# Patient Record
Sex: Male | Born: 2000 | Race: White | Hispanic: No | Marital: Single | Smoking: Never smoker
Health system: Southern US, Community
[De-identification: ages and names within clinical notes are randomized; demographics above are authoritative.]

## PROBLEM LIST (undated history)

## (undated) DIAGNOSIS — F419 Anxiety disorder, unspecified: Secondary | ICD-10-CM

---
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
## 2018-11-06 IMAGING — CR DG HAND COMPLETE 3+V*L*
3 series · 3 of 3 positions shown · non-contrast
Comparison: None.

CLINICAL DATA: Injury

EXAM:
LEFT HAND - COMPLETE 3+ VIEW

[hand ap]
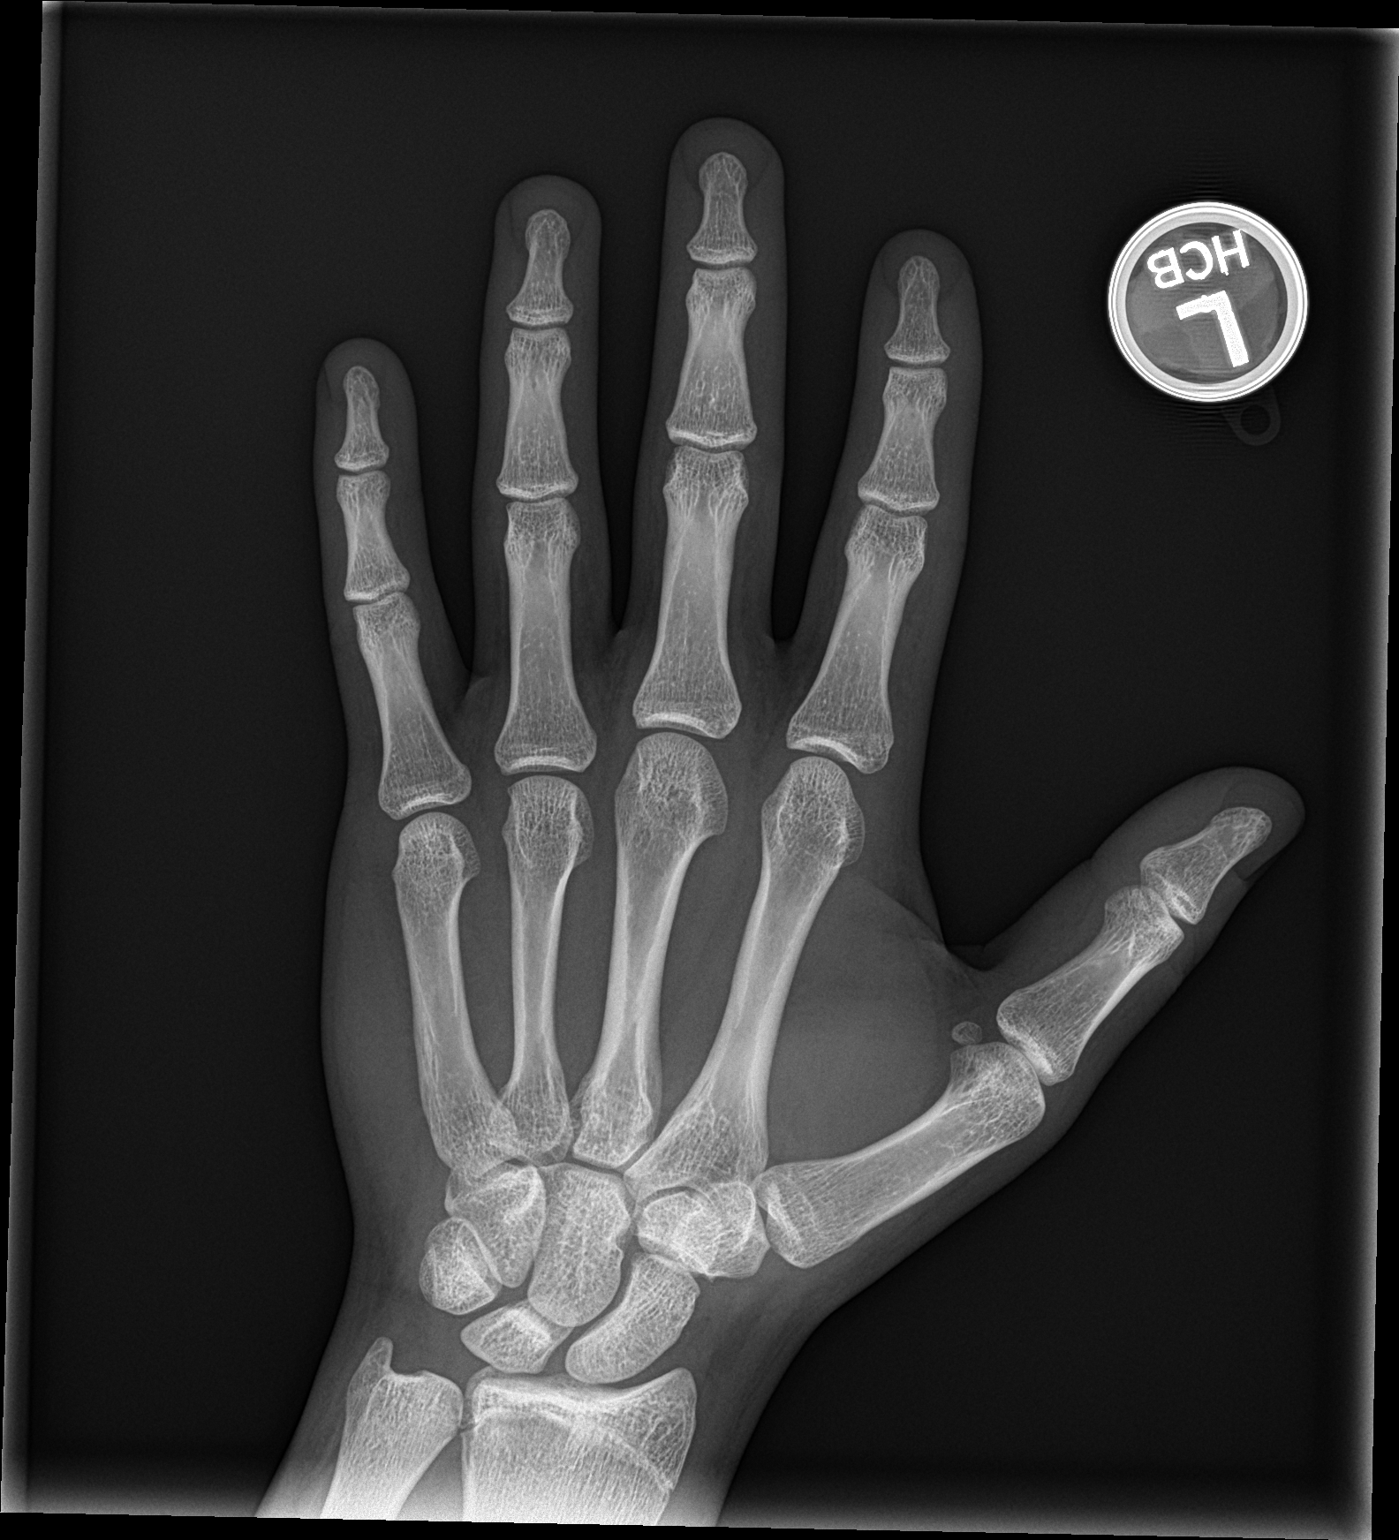

[hand obl]
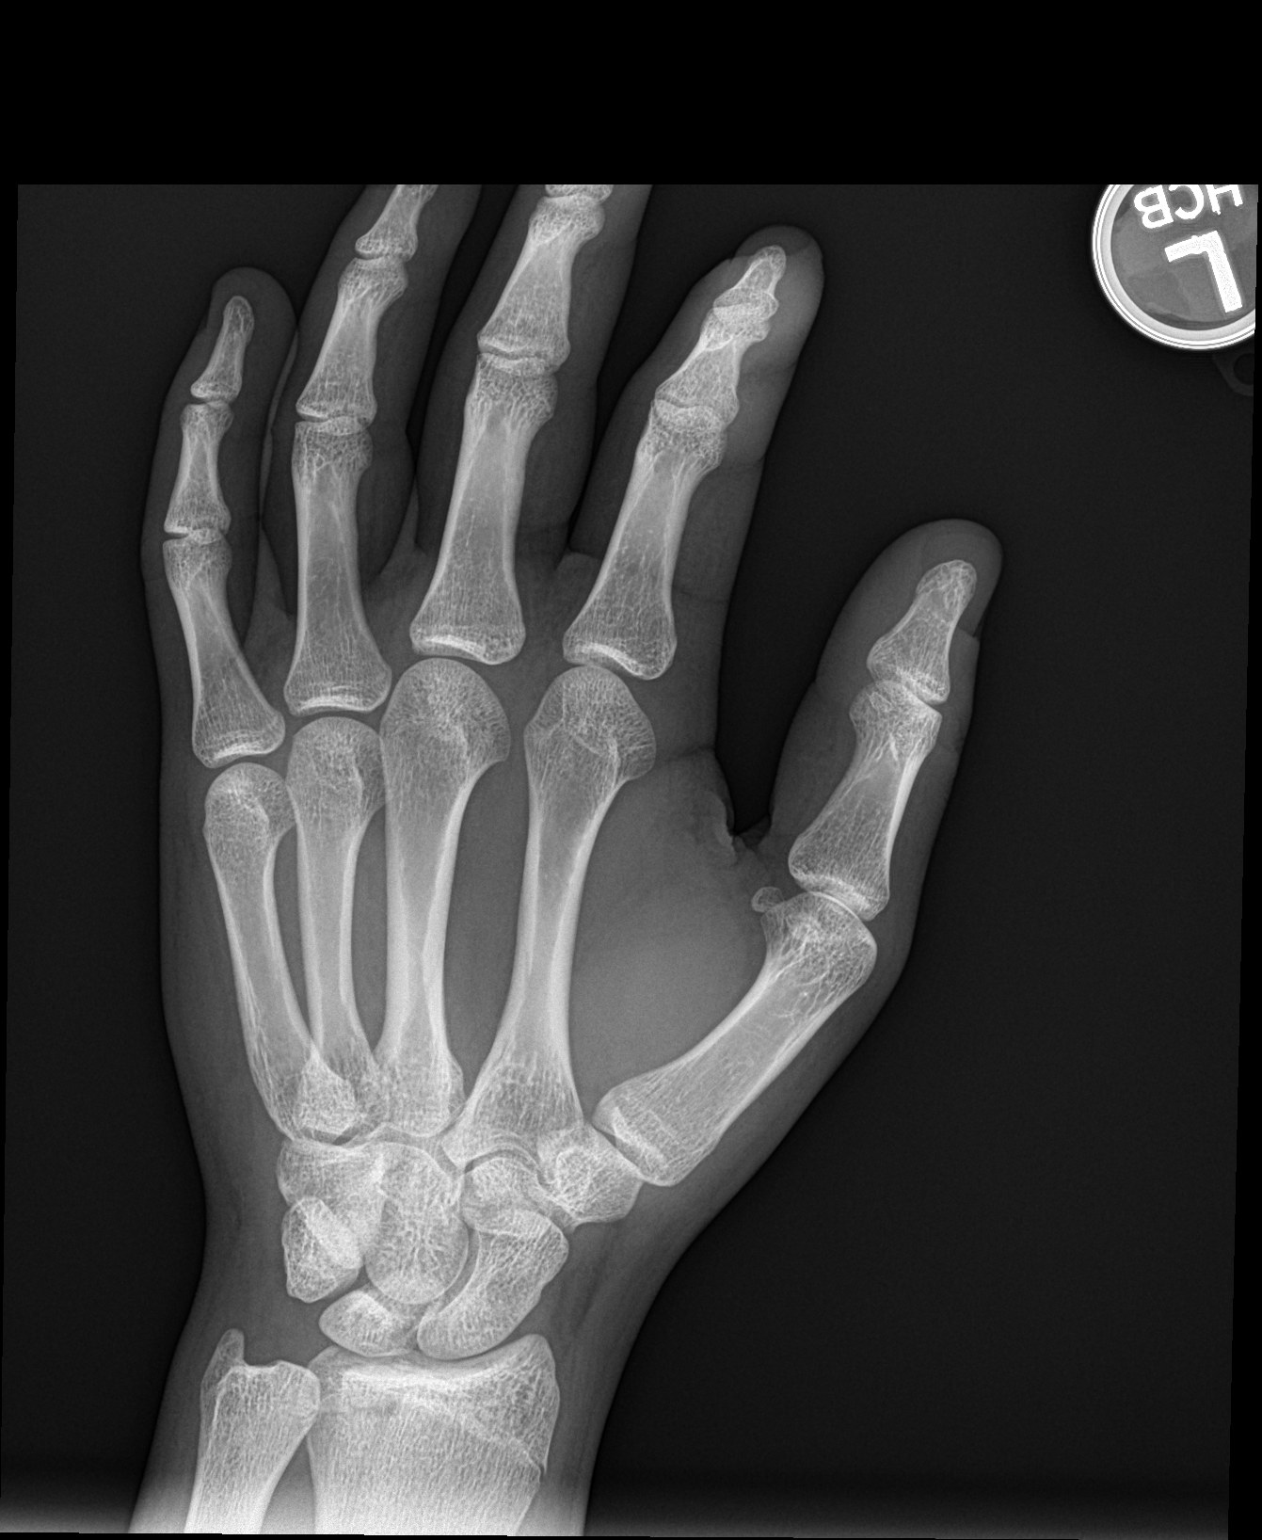

[hand lat]
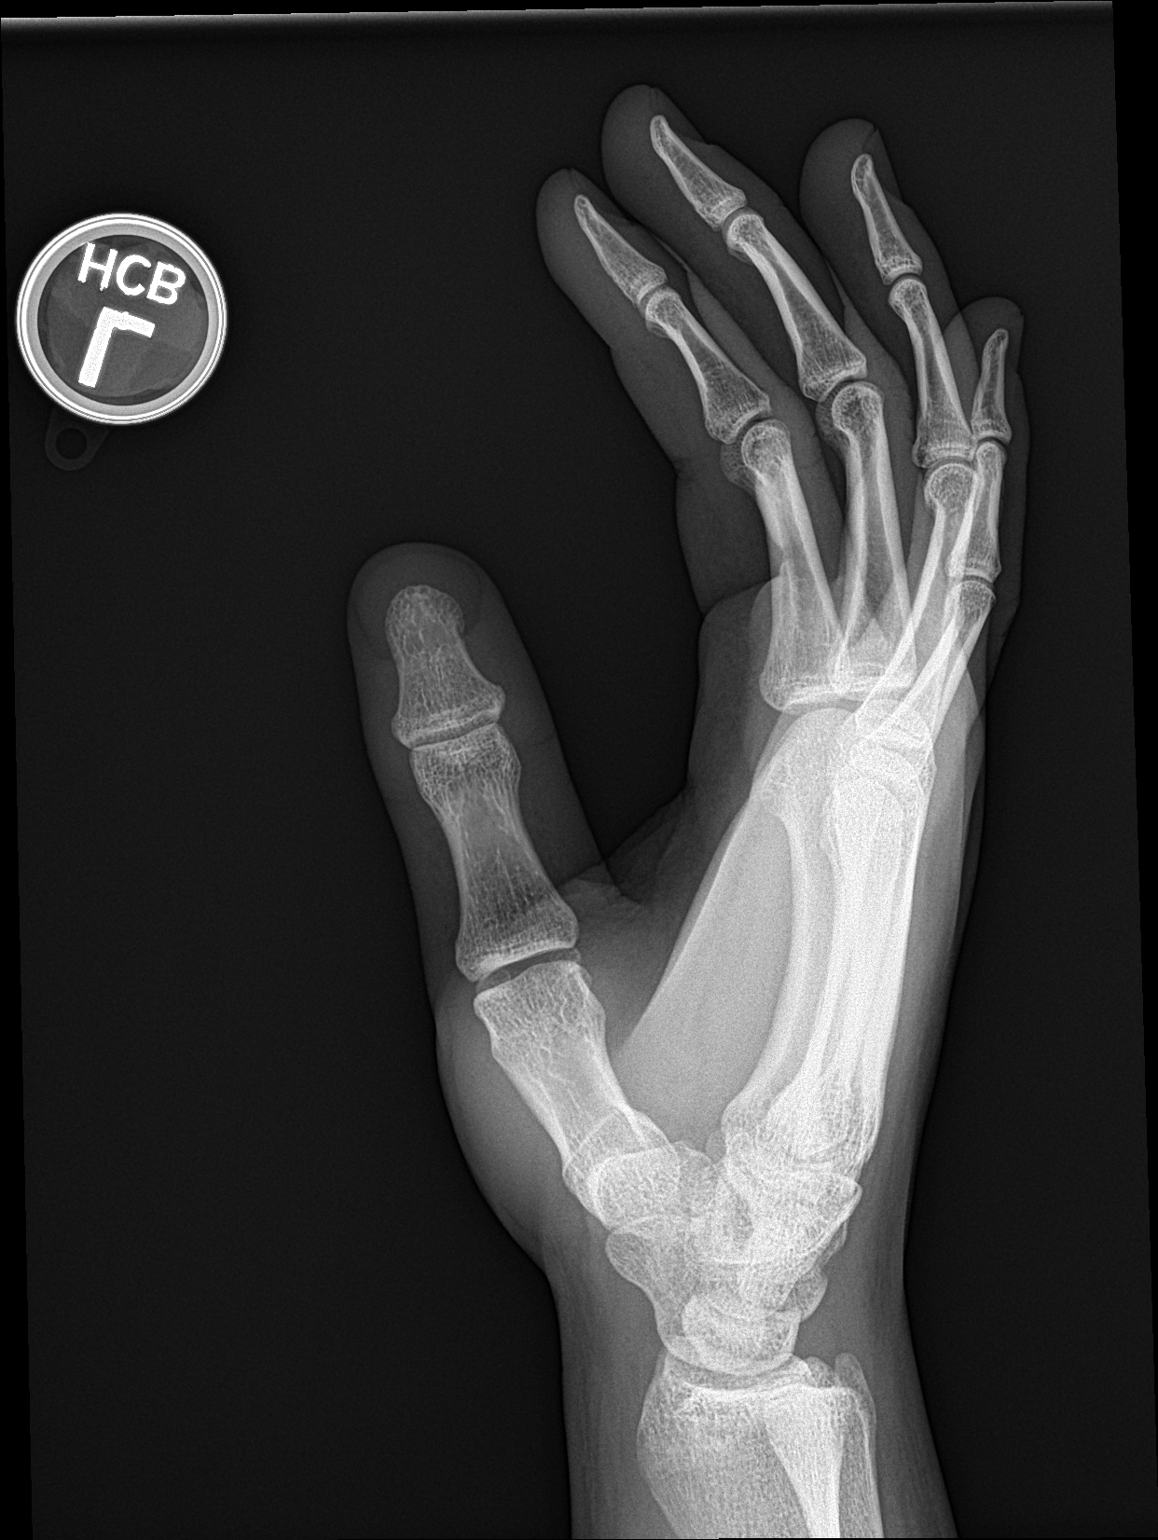

[3 of 3 positions shown; findings below may reference images not displayed]

FINDINGS: No acute fracture. No dislocation.  Unremarkable soft tissues.
IMPRESSION: No acute bony pathology.

## 2019-08-12 ENCOUNTER — Other Ambulatory Visit: Payer: Self-pay

## 2019-08-12 DIAGNOSIS — Z20822 Contact with and (suspected) exposure to covid-19: Secondary | ICD-10-CM

## 2019-08-13 LAB — NOVEL CORONAVIRUS, NAA: SARS-CoV-2, NAA: NOT DETECTED

## 2019-11-08 ENCOUNTER — Ambulatory Visit: Payer: No Typology Code available for payment source | Attending: Internal Medicine

## 2019-11-08 DIAGNOSIS — Z20822 Contact with and (suspected) exposure to covid-19: Secondary | ICD-10-CM

## 2019-11-08 DIAGNOSIS — U071 COVID-19: Secondary | ICD-10-CM | POA: Insufficient documentation

## 2019-11-09 LAB — NOVEL CORONAVIRUS, NAA: SARS-CoV-2, NAA: DETECTED — AB

## 2019-11-17 ENCOUNTER — Ambulatory Visit: Payer: No Typology Code available for payment source | Attending: Internal Medicine

## 2019-11-17 DIAGNOSIS — Z20822 Contact with and (suspected) exposure to covid-19: Secondary | ICD-10-CM | POA: Insufficient documentation

## 2019-11-18 LAB — NOVEL CORONAVIRUS, NAA: SARS-CoV-2, NAA: NOT DETECTED

## 2021-05-15 ENCOUNTER — Emergency Department (HOSPITAL_COMMUNITY)
Admission: EM | Admit: 2021-05-15 | Discharge: 2021-05-15 | Disposition: A | Discharge status: Home / Self Care | Payer: Worker's Compensation | Attending: Emergency Medicine | Admitting: Emergency Medicine

## 2021-05-15 ENCOUNTER — Other Ambulatory Visit: Payer: Self-pay

## 2021-05-15 ENCOUNTER — Emergency Department (HOSPITAL_COMMUNITY): Payer: Worker's Compensation

## 2021-05-15 DIAGNOSIS — S61411A Laceration without foreign body of right hand, initial encounter: Secondary | ICD-10-CM | POA: Insufficient documentation

## 2021-05-15 DIAGNOSIS — S6991XA Unspecified injury of right wrist, hand and finger(s), initial encounter: Secondary | ICD-10-CM

## 2021-05-15 DIAGNOSIS — Z7722 Contact with and (suspected) exposure to environmental tobacco smoke (acute) (chronic): Secondary | ICD-10-CM | POA: Insufficient documentation

## 2021-05-15 DIAGNOSIS — W240XXA Contact with lifting devices, not elsewhere classified, initial encounter: Secondary | ICD-10-CM | POA: Insufficient documentation

## 2021-05-15 DIAGNOSIS — Y990 Civilian activity done for income or pay: Secondary | ICD-10-CM | POA: Diagnosis not present

## 2021-05-15 DIAGNOSIS — Y9389 Activity, other specified: Secondary | ICD-10-CM | POA: Diagnosis not present

## 2021-05-15 DIAGNOSIS — Z20822 Contact with and (suspected) exposure to covid-19: Secondary | ICD-10-CM | POA: Insufficient documentation

## 2021-05-15 LAB — RESP PANEL BY RT-PCR (FLU A&B, COVID) ARPGX2
Influenza A by PCR: NEGATIVE
Influenza B by PCR: NEGATIVE
SARS Coronavirus 2 by RT PCR: NEGATIVE

## 2021-05-15 MED ORDER — LIDOCAINE-EPINEPHRINE (PF) 2 %-1:200000 IJ SOLN: 20.0000 mL | Freq: Once | INTRAMUSCULAR | Status: DC

## 2021-05-15 MED ORDER — MORPHINE SULFATE (PF) 4 MG/ML IV SOLN
4.0000 mg | Freq: Once | INTRAVENOUS | Status: AC
  Administered 2021-05-15: 4 mg via INTRAVENOUS
  Filled 2021-05-15: qty 1

## 2021-05-15 MED ORDER — SODIUM CHLORIDE 0.9 % IV BOLUS
500.0000 mL | Freq: Once | INTRAVENOUS | Status: AC
  Administered 2021-05-15: 500 mL via INTRAVENOUS

## 2021-05-15 MED ORDER — CEPHALEXIN 500 MG PO CAPS: 500.0000 mg | ORAL_CAPSULE | Freq: Four times a day (QID) | ORAL | 0 refills | Status: AC

## 2021-05-15 MED ORDER — LIDOCAINE-EPINEPHRINE 1 %-1:100000 IJ SOLN
20.0000 mL | Freq: Once | INTRAMUSCULAR | Status: AC
  Administered 2021-05-15: 20 mL
  Filled 2021-05-15: qty 1

## 2021-05-15 MED ORDER — HYDROCODONE-ACETAMINOPHEN 5-325 MG PO TABS: 1.0000 | ORAL_TABLET | Freq: Four times a day (QID) | ORAL | 0 refills | Status: DC | PRN

## 2021-05-15 NOTE — ED Triage Notes (Addendum)
Pt here d/t arm/ hand injury. 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 No LOC. Pt alert and oriented X4. Bleeding controlled. Pulses intact. Pt able to move and feel fingers    150 mcg fent 2g Ancef 130/80 HR 78 RR 20 100% RA

## 2021-05-15 NOTE — Progress Notes (Signed)
Orthopedic Tech Progress Note Patient Details:  Jose Woodward. September 07, 2001 [id number]  Ortho Devices Type of Ortho Device: Velcro wrist splint Ortho Device/Splint Interventions: Ordered, Application   Post Interventions Patient Tolerated: Well  [name] 05/15/2021, 7:41 PM

## 2021-05-15 NOTE — Discharge Instructions (Addendum)
Take antibiotics as prescribed.  Take the entire course Take ibuprofen 3 times a day with meals.  Do not take other anti-inflammatories at the same time (Advil, Motrin, naproxen, Aleve). You may supplement with Tylenol if you need further pain control. Use ice packs, 20 minutes at a time, 3 times a day to help with pain and swelling. Keep your hand elevated to help with pain and swelling. Use Norco as needed for severe breakthrough pain.  Have caution, this may make you tired or groggy.  Do not drive or operate heavy machinery while taking this medicine. Call the office listed below to set up a follow-up appointment for further evaluation of your hand. Regarding the wound, keep clean, wash at least twice a day with soap and water.  You do not need to keep it covered unless it is bleeding or you in a dirty environment. If you develop fever, pus draining from the area, or severe worsening pain, redness take up or down your hand, return to the emergency room or follow-up in the clinic listed below. If you have bleeding, hold firm, constant pressure for 20 minutes. If bleeding persists return to the ER.

## 2021-05-15 NOTE — ED Notes (Signed)
Pt used phone. Spoke to mother and provided update.

## 2021-05-15 NOTE — ED Provider Notes (Signed)
[facility] EMERGENCY DEPARTMENT Provider Note   CSN: [id number] Arrival date & time: 05/15/21  1532     History No chief complaint on file.   Jose Woodward. is a 20 y.o. male presenting for evaluation of right hand injury.  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  He had acute onset pain.  He reports no injury elsewhere.  Did not fall or hit his head.  He has no medical problems, takes medications daily.  States his tetanus is up-to-date.  No numbness or tingling.  Per EMS, patient was given 150 mcg of fentanyl and 2 g of Ancef PTA.  HPI     No past medical history on file.  There are no problems to display for this patient.   No past surgical history on file.     No family history on file.  Social History   Tobacco Use   Smoking status: Passive Smoke Exposure - Never Smoker   Smokeless tobacco: Never  Substance Use Topics   Alcohol use: No   Drug use: No    Home Medications Prior to Admission medications   Medication Sig Start Date End Date Taking? Authorizing Provider  cephALEXin (KEFLEX) 500 MG capsule Take 1 capsule (500 mg total) by mouth 4 (four) times daily for 5 days. 05/15/21 05/20/21 Yes Doug Bucklin, PA-C  HYDROcodone-acetaminophen (NORCO/VICODIN) 5-325 MG tablet Take 1 tablet by mouth every 6 (six) hours as needed. 05/15/21  Yes Hilary Pundt, PA-C  ibuprofen (ADVIL,MOTRIN) 400 MG tablet Take 400 mg by mouth every 6 (six) hours as needed for mild pain.   Yes [provider]    Allergies    Sulfa antibiotics  Review of Systems   Review of Systems  Skin:  Positive for wound.  All other systems reviewed and are negative.  Physical Exam Updated Vital Signs BP 140/88   Pulse 82   Temp 98.1 F (36.7 C) (Oral)   Resp 16   Ht 5\' 10"  (1.778 m)   Wt 86.2 kg    SpO2 98%   BMI 27.26 kg/m   Physical Exam Vitals and nursing note reviewed.  Constitutional:      General: He is not in acute distress.    Appearance: Normal appearance.     Comments: nontoxic  HENT:     Head: Normocephalic and atraumatic.  Eyes:     Conjunctiva/sclera: Conjunctivae normal.     Pupils: Pupils are equal, round, and reactive to light.  Cardiovascular:     Rate and Rhythm: Normal rate and regular rhythm.     Pulses: Normal pulses.  Pulmonary:     Effort: Pulmonary effort is normal. No respiratory distress.     Breath sounds: Normal breath sounds. No wheezing.     Comments: Speaking in full sentences.  Clear lung sounds in all fields. Abdominal:     General: There is no distension.     Palpations: Abdomen is soft.     Tenderness: There is no abdominal tenderness.  Musculoskeletal:     Cervical back: Normal range of motion and neck supple.     Comments: Injury to the dorsum of the right hand over the third and fourth metacarpals.  No active bleeding.  Good distal sensation and cap refill.  Diffuse tenderness palpation of the entire right hand, wrist, forearm, and elbow.  No obvious deformity in the forearm or elbow.  Radial pulse 2+.  Injury does not go through to the palmar surface of the hand. Patient able to flex and extend at the wrist, ROM limited due to pain and swelling.  Patient able to flex and extend at the MCP, DIP, PIP of all fingers, ROM limited due to pain and swelling.  Skin:    General: Skin is warm and dry.     Capillary Refill: Capillary refill takes less than 2 seconds.  Neurological:     Mental Status: He is alert and oriented to person, place, and time.  Psychiatric:        Mood and Affect: Mood and affect normal.        Speech: Speech normal.        Behavior: Behavior normal.      ED Results / Procedures / Treatments   Labs (all labs ordered are listed, but only abnormal results are displayed) Labs Reviewed  RESP PANEL BY RT-PCR (FLU  A&B, COVID) [id number]    EKG None  Radiology DG Wrist Complete Right  Result Date: 05/15/2021 CLINICAL DATA:  Crush injury, pain, laceration to posterior metacarpals EXAM: RIGHT HAND - COMPLETE 3+ VIEW; RIGHT WRIST - COMPLETE 3+ VIEW COMPARISON:  None. FINDINGS: There is no evidence of fracture or dislocation. There is no evidence of arthropathy or other focal bone abnormality. Soft tissue laceration and edema of the dorsum of the right hand. IMPRESSION: 1. No fracture or dislocation of the right hand or wrist. The carpus is normally aligned. 2. Soft tissue laceration and edema of the dorsum of the right hand. Electronically Signed   By: Lauralyn Primes M.D.   On: 05/15/2021 17:33   DG Hand Complete Right  Result Date: 05/15/2021 CLINICAL DATA:  Crush injury, pain, laceration to posterior metacarpals EXAM: RIGHT HAND - COMPLETE 3+ VIEW; RIGHT WRIST - COMPLETE 3+ VIEW COMPARISON:  None. FINDINGS: There is no evidence of fracture or dislocation. There is no evidence of arthropathy or other focal bone abnormality. Soft tissue laceration and edema of the dorsum of the right hand. IMPRESSION: 1. No fracture or dislocation of the right hand or wrist. The carpus is normally aligned. 2. Soft tissue laceration and edema of the dorsum of the right hand. Electronically Signed   By: Lauralyn Primes M.D.   On: 05/15/2021 17:33    Procedures .Marland KitchenLaceration Repair  Date/Time: 05/15/2021 6:42 PM Performed by: Jose Apley, PA-C Authorized by: Jose Apley, PA-C   Consent:    Consent obtained:  Verbal   Consent given by:  Patient   Risks, benefits, and alternatives were discussed: yes     Risks discussed:  Pain, poor wound healing, infection, need for additional repair, nerve damage, tendon damage and poor cosmetic result Anesthesia:    Anesthesia method:  Local infiltration   Local anesthetic:  Lidocaine 2% WITH epi Laceration details:    Location:  Hand   Hand location:  R hand, dorsum   Length (cm):   3   Depth (mm):  3 Pre-procedure details:    Preparation:  Patient was prepped and draped in usual sterile fashion and imaging obtained to evaluate for foreign bodies Exploration:    Hemostasis achieved with:  Direct pressure   Wound exploration: wound explored through full range of motion and entire depth of wound visualized     Wound extent: no foreign bodies/material noted, no muscle damage noted, no nerve damage noted, no tendon damage noted, no underlying  fracture noted and no vascular damage noted   Treatment:    Area cleansed with:  Saline and povidone-iodine   Amount of cleaning:  Extensive   Irrigation solution:  Sterile water   Irrigation method:  Syringe Skin repair:    Repair method:  Sutures   Suture size:  4-0   Suture material:  Prolene   Suture technique:  Simple interrupted   Number of sutures:  4 Approximation:    Approximation:  Close Repair type:    Repair type:  Simple Post-procedure details:    Dressing:  Splint for protection and sterile dressing   Procedure completion:  Tolerated well, no immediate complications   Medications Ordered in ED Medications  lidocaine-EPINEPHrine (XYLOCAINE W/EPI) 1 %-1:100000 (with pres) injection 20 mL (has no administration in time range)  morphine 4 MG/ML injection 4 mg (4 mg Intravenous Given 05/15/21 1554)  sodium chloride 0.9 % bolus 500 mL (0 mLs Intravenous Stopped 05/15/21 1655)  morphine 4 MG/ML injection 4 mg (4 mg Intravenous Given 05/15/21 1712)    ED Course  I have reviewed the triage vital signs and the nursing notes.  Pertinent labs & imaging results that were available during my care of the patient were reviewed by me and considered in my medical decision making (see chart for details).    MDM Rules/Calculators/A&P                           Patient presenting for evaluation of hand injury.  On exam, patient is neurovascularly intact.  Significant mechanism, as such, obtain x-rays.  Tetanus is up-to-date.   Patient is already received Ancef.  Will pain control and follow-up on imaging.  X-rays viewed and independently interpreted by me, no fracture or dislocation.  No obvious foreign body.  Discussed findings with patient and mom. Laceration repaired as described above.  Discussed aftercare instructions.  At this time, patient appears safe for discharge.  Return precautions given.  Patient states he understands and agrees to plan.   Final Clinical Impression(s) / ED Diagnoses Final diagnoses:  Hand injury, right, initial encounter  Laceration of right hand without foreign body, initial encounter    Rx / DC Orders ED Discharge Orders          Ordered    cephALEXin (KEFLEX) 500 MG capsule  4 times daily        05/15/21 1841    HYDROcodone-acetaminophen (NORCO/VICODIN) 5-325 MG tablet  Every 6 hours PRN        05/15/21 1841             Jose Apley, PA-C 05/15/21 1844    Linwood Dibbles, MD 05/16/21 2021

## 2022-04-10 ENCOUNTER — Other Ambulatory Visit (HOSPITAL_COMMUNITY): Payer: Self-pay

## 2022-04-10 MED ORDER — BUPROPION HCL ER (XL) 150 MG PO TB24
150.0000 mg | ORAL_TABLET | Freq: Every day | ORAL | 11 refills | Status: DC
  Filled 2022-04-10: qty 30, 30d supply, fill #0
  Filled 2022-05-03: qty 30, 30d supply, fill #1
  Filled 2022-06-04: qty 30, 30d supply, fill #2
  Filled 2022-07-10: qty 30, 30d supply, fill #3
  Filled 2022-08-11: qty 30, 30d supply, fill #4
  Filled 2022-09-10: qty 30, 30d supply, fill #5
  Filled 2022-10-10: qty 30, 30d supply, fill #6
  Filled 2022-11-20: qty 30, 30d supply, fill #7
  Filled 2022-12-30: qty 30, 30d supply, fill #8
  Filled 2023-02-16: qty 30, 30d supply, fill #9
  Filled 2023-03-04: qty 30, 30d supply, fill #10

## 2022-05-03 ENCOUNTER — Other Ambulatory Visit (HOSPITAL_COMMUNITY): Payer: Self-pay

## 2022-05-06 ENCOUNTER — Other Ambulatory Visit (HOSPITAL_COMMUNITY): Payer: Self-pay

## 2022-06-04 ENCOUNTER — Other Ambulatory Visit (HOSPITAL_COMMUNITY): Payer: Self-pay

## 2022-06-28 ENCOUNTER — Ambulatory Visit
Admission: EM | Admit: 2022-06-28 | Discharge: 2022-06-28 | Disposition: A | Discharge status: Home / Self Care | Payer: BC Managed Care – PPO | Attending: Urgent Care | Admitting: Urgent Care

## 2022-06-28 ENCOUNTER — Encounter: Payer: Self-pay | Admitting: *Deleted

## 2022-06-28 ENCOUNTER — Ambulatory Visit (INDEPENDENT_AMBULATORY_CARE_PROVIDER_SITE_OTHER): Payer: No Typology Code available for payment source

## 2022-06-28 DIAGNOSIS — R404 Transient alteration of awareness: Secondary | ICD-10-CM | POA: Diagnosis not present

## 2022-06-28 DIAGNOSIS — R55 Syncope and collapse: Secondary | ICD-10-CM | POA: Diagnosis not present

## 2022-06-28 HISTORY — DX: Anxiety disorder, unspecified: F41.9

## 2022-06-28 LAB — POCT FASTING CBG KUC MANUAL ENTRY: POCT Glucose (KUC): 108 mg/dL — AB (ref 70–99)
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
## 2022-06-28 NOTE — Discharge Instructions (Signed)
Your EKG, glucose, orthostatics and head CT do not reveal any source of cause of your altered consciousness.  Given your family history of cardiac disease, please follow up with your PCP to see if a holter monitor or an event monitor would be beneficial. We will call with the results of your lab tests today. If you have any new or worsening symptoms, please head to the ER. Please also discuss your wellbutrin with your PCP as this medication can lower the seizure threshold.

## 2022-06-29 ENCOUNTER — Telehealth: Payer: Self-pay

## 2022-06-29 LAB — CBC WITH DIFFERENTIAL/PLATELET
Absolute Monocytes: 633 cells/uL (ref 200–950)
Basophils Absolute: 33 cells/uL (ref 0–200)
Basophils Relative: 0.3 %
Eosinophils Absolute: 22 cells/uL (ref 15–500)
Eosinophils Relative: 0.2 %
HCT: 45.4 % (ref 38.5–50.0)
Hemoglobin: 16.1 g/dL (ref 13.2–17.1)
Lymphs Abs: 1632 cells/uL (ref 850–3900)
MCH: 32.6 pg (ref 27.0–33.0)
MCHC: 35.5 g/dL (ref 32.0–36.0)
MCV: 91.9 fL (ref 80.0–100.0)
MPV: 9.7 fL (ref 7.5–12.5)
Monocytes Relative: 5.7 %
Neutro Abs: 8780 cells/uL — ABNORMAL HIGH (ref 1500–7800)
Neutrophils Relative %: 79.1 %
Platelets: 329 10*3/uL (ref 140–400)
RBC: 4.94 10*6/uL (ref 4.20–5.80)
RDW: 11.9 % (ref 11.0–15.0)
Total Lymphocyte: 14.7 %
WBC: 11.1 10*3/uL — ABNORMAL HIGH (ref 3.8–10.8)

## 2022-06-29 LAB — COMPREHENSIVE METABOLIC PANEL
AG Ratio: 1.8 (calc) (ref 1.0–2.5)
ALT: 13 U/L (ref 9–46)
AST: 14 U/L (ref 10–40)
Albumin: 4.8 g/dL (ref 3.6–5.1)
Alkaline phosphatase (APISO): 68 U/L (ref 36–130)
BUN: 15 mg/dL (ref 7–25)
CO2: 26 mmol/L (ref 20–32)
Calcium: 9.7 mg/dL (ref 8.6–10.3)
Chloride: 103 mmol/L (ref 98–110)
Creat: 0.96 mg/dL (ref 0.60–1.24)
Globulin: 2.6 g/dL (calc) (ref 1.9–3.7)
Glucose, Bld: 89 mg/dL (ref 65–99)
Potassium: 4.6 mmol/L (ref 3.5–5.3)
Sodium: 139 mmol/L (ref 135–146)
Total Bilirubin: 0.5 mg/dL (ref 0.2–1.2)
Total Protein: 7.4 g/dL (ref 6.1–8.1)

## 2022-06-29 NOTE — Telephone Encounter (Signed)
Pt states he is feeling well after UC visit. Advised to call back if any questions or concerns.

## 2022-07-10 ENCOUNTER — Other Ambulatory Visit (HOSPITAL_COMMUNITY): Payer: Self-pay

## 2022-08-12 ENCOUNTER — Other Ambulatory Visit (HOSPITAL_COMMUNITY): Payer: Self-pay

## 2022-09-10 ENCOUNTER — Other Ambulatory Visit (HOSPITAL_COMMUNITY): Payer: Self-pay

## 2022-10-15 ENCOUNTER — Other Ambulatory Visit (HOSPITAL_COMMUNITY): Payer: Self-pay

## 2022-11-20 ENCOUNTER — Other Ambulatory Visit (HOSPITAL_BASED_OUTPATIENT_CLINIC_OR_DEPARTMENT_OTHER): Payer: Self-pay

## 2022-11-21 ENCOUNTER — Other Ambulatory Visit (HOSPITAL_COMMUNITY): Payer: Self-pay

## 2022-11-21 ENCOUNTER — Other Ambulatory Visit: Payer: Self-pay

## 2022-12-30 ENCOUNTER — Other Ambulatory Visit (HOSPITAL_COMMUNITY): Payer: Self-pay

## 2023-02-16 ENCOUNTER — Other Ambulatory Visit: Payer: Self-pay

## 2023-03-05 ENCOUNTER — Other Ambulatory Visit: Payer: Self-pay

## 2023-04-23 ENCOUNTER — Other Ambulatory Visit: Payer: Self-pay

## 2023-04-23 MED ORDER — BUPROPION HCL ER (XL) 150 MG PO TB24
150.0000 mg | ORAL_TABLET | Freq: Every day | ORAL | 11 refills | Status: DC
  Filled 2023-04-23: qty 30, 30d supply, fill #0
  Filled 2023-05-23: qty 30, 30d supply, fill #1
  Filled 2023-07-02: qty 30, 30d supply, fill #2
  Filled 2023-07-29: qty 30, 30d supply, fill #3
  Filled 2023-09-02: qty 30, 30d supply, fill #4
  Filled 2023-09-26: qty 30, 30d supply, fill #5
  Filled 2023-10-26: qty 30, 30d supply, fill #6
  Filled 2023-11-28: qty 30, 30d supply, fill #7
  Filled 2023-12-29: qty 30, 30d supply, fill #8
  Filled 2024-01-26: qty 30, 30d supply, fill #9
  Filled 2024-03-02: qty 30, 30d supply, fill #10
  Filled 2024-04-02 (×2): qty 30, 30d supply, fill #11

## 2023-05-23 ENCOUNTER — Other Ambulatory Visit: Payer: Self-pay

## 2023-07-02 ENCOUNTER — Other Ambulatory Visit: Payer: Self-pay
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
## 2023-07-29 ENCOUNTER — Other Ambulatory Visit: Payer: Self-pay

## 2023-08-04 ENCOUNTER — Other Ambulatory Visit: Payer: Self-pay

## 2023-09-02 ENCOUNTER — Other Ambulatory Visit: Payer: Self-pay

## 2023-09-26 ENCOUNTER — Other Ambulatory Visit: Payer: Self-pay

## 2023-10-27 ENCOUNTER — Other Ambulatory Visit: Payer: Self-pay

## 2023-11-28 ENCOUNTER — Other Ambulatory Visit: Payer: Self-pay

## 2023-12-29 ENCOUNTER — Other Ambulatory Visit: Payer: Self-pay

## 2024-01-26 ENCOUNTER — Other Ambulatory Visit: Payer: Self-pay

## 2024-03-03 ENCOUNTER — Other Ambulatory Visit: Payer: Self-pay

## 2024-04-02 ENCOUNTER — Other Ambulatory Visit: Payer: Self-pay

## 2024-04-29 ENCOUNTER — Other Ambulatory Visit: Payer: Self-pay

## 2024-04-29 MED ORDER — BUPROPION HCL ER (XL) 150 MG PO TB24
150.0000 mg | ORAL_TABLET | Freq: Every day | ORAL | 11 refills | Status: AC
  Filled 2024-04-29: qty 30, 30d supply, fill #0
  Filled 2024-06-03: qty 30, 30d supply, fill #1
  Filled 2024-07-12: qty 30, 30d supply, fill #2
  Filled 2024-08-10: qty 30, 30d supply, fill #3
  Filled 2024-09-14: qty 30, 30d supply, fill #4
  Filled 2024-10-13: qty 30, 30d supply, fill #5

## 2024-05-03 ENCOUNTER — Other Ambulatory Visit: Payer: Self-pay

## 2024-07-14 ENCOUNTER — Other Ambulatory Visit: Payer: Self-pay
